# Patient Record
Sex: Female | Born: 2000 | Race: Black or African American | Hispanic: No | Marital: Single | Smoking: Never smoker
Health system: Southern US, Community
[De-identification: ages and names within clinical notes are randomized; demographics above are authoritative.]

---

## 2021-08-11 ENCOUNTER — Other Ambulatory Visit: Payer: Self-pay

## 2021-08-11 ENCOUNTER — Encounter: Payer: Self-pay | Admitting: Nurse Practitioner

## 2021-08-11 ENCOUNTER — Encounter (INDEPENDENT_AMBULATORY_CARE_PROVIDER_SITE_OTHER): Payer: Self-pay

## 2021-08-11 ENCOUNTER — Ambulatory Visit (INDEPENDENT_AMBULATORY_CARE_PROVIDER_SITE_OTHER): Payer: Self-pay | Admitting: Nurse Practitioner

## 2021-08-11 VITALS — BP 110/75 | HR 78 | Temp 99.3°F | Ht 71.0 in | Wt 98.4 lb

## 2021-08-11 DIAGNOSIS — M62838 Other muscle spasm: Secondary | ICD-10-CM

## 2021-08-11 MED ORDER — TIZANIDINE HCL 4 MG PO TABS
4.0000 mg | ORAL_TABLET | Freq: Three times a day (TID) | ORAL | 0 refills | Status: AC | PRN
Start: 2021-08-11 — End: 2021-08-16

## 2021-08-11 NOTE — Patient Instructions (Addendum)
1. Muscle spasms of neck ? ? ?- tiZANidine (ZANAFLEX) 4 MG tablet; Take 1 tablet (4 mg total) by mouth every 8 (eight) hours as needed for up to 5 days for muscle spasms.  Dispense: 15 tablet; Refill: 0 ? ?May alternate heat and ice ? ? ?Follow up: ? ?Follow up for physical in 4-6 weeks with Dr. Andrey Campanile or Amy ? ? ?

## 2021-08-11 NOTE — Progress Notes (Signed)
@  Patient ID: Sheena Anderson, female    DOB: July 30, 2000, 21 y.o.   MRN: [id number] ? ?Chief Complaint  ?Patient presents with  ? New Patient (Initial Visit)  ? ? ?Referring provider: ?No ref. provider found ? ? ?HPI ? ?Patient presents today to establish care.  She is not currently on any medications.  She does not have any significant health history.  She recently moved here from [place].  Interpreter was used for her visit today.  Patient states that she does get muscle spasms to her neck.  She does not have the muscle spasms today currently but she states that this is a pretty frequent problem.  She states that she notices it more in cold weather.  She states that she has muscle tightness on both sides of her neck. Denies f/c/s, n/v/d, hemoptysis, PND, chest pain or edema. ? ? ? ?Not on File ? ? ?There is no immunization history on file for this patient. ? ?History reviewed. No pertinent past medical history. ? ?Tobacco History: ?Social History  ? ?Tobacco Use  ?Smoking Status Never  ?Smokeless Tobacco Never  ? ?Counseling given: Not Answered ? ? ?Outpatient Encounter Medications as of 08/11/2021  ?Medication Sig  ? tiZANidine (ZANAFLEX) 4 MG tablet Take 1 tablet (4 mg total) by mouth every 8 (eight) hours as needed for up to 5 days for muscle spasms.  ? ?No facility-administered encounter medications on file as of 08/11/2021.  ? ? ? ?Review of Systems ? ?Review of Systems  ?Constitutional: Negative.   ?HENT: Negative.    ?Cardiovascular: Negative.   ?Gastrointestinal: Negative.   ?Musculoskeletal:  Positive for myalgias.  ?Allergic/Immunologic: Negative.   ?Neurological: Negative.   ?Psychiatric/Behavioral: Negative.     ? ? ? ?Physical Exam ? ?BP 110/75   Pulse 78   Temp 99.3 ?F (37.4 ?C)   Ht 5\' 11"  (1.803 m)   Wt 98 lb 6.4 oz (44.6 kg)   SpO2 (!) 88%   BMI 13.72 kg/m?  ? ?Wt Readings from Last 5 Encounters:  ?08/11/21 98 lb 6.4 oz (44.6 kg)  ? ? ? ?Physical Exam ?Vitals and nursing note reviewed.  ?Constitutional:    ?   General: She is not in acute distress. ?   Appearance: She is well-developed.  ?Cardiovascular:  ?   Rate and Rhythm: Normal rate and regular rhythm.  ?Pulmonary:  ?   Effort: Pulmonary effort is normal.  ?   Breath sounds: Normal breath sounds.  ?Neurological:  ?   Mental Status: She is alert and oriented to person, place, and time.  ? ? ? ?Lab Results: ? ?CBC ?No results found for: WBC, RBC, HGB, HCT, PLT, MCV, MCH, MCHC, RDW, LYMPHSABS, MONOABS, EOSABS, BASOSABS ? ?BMET ?No results found for: NA, K, CL, CO2, GLUCOSE, BUN, CREATININE, CALCIUM, GFRNONAA, GFRAA ? ?BNP ?No results found for: BNP ? ?ProBNP ?No results found for: PROBNP ? ?Imaging: ?No results found. ? ? ?Assessment & Plan:  ? ?Muscle spasms of neck ?- tiZANidine (ZANAFLEX) 4 MG tablet; Take 1 tablet (4 mg total) by mouth every 8 (eight) hours as needed for up to 5 days for muscle spasms.  Dispense: 15 tablet; Refill: 0 ? ?May alternate heat and ice ? ? ?Follow up: ? ?Follow up for physical in 4-6 weeks with Dr. 08/13/21 or Amy ? ? ? ? ?Andrey Campanile, NP ?08/11/2021 ? ?

## 2021-08-11 NOTE — Assessment & Plan Note (Signed)
-   tiZANidine (ZANAFLEX) 4 MG tablet; Take 1 tablet (4 mg total) by mouth every 8 (eight) hours as needed for up to 5 days for muscle spasms.  Dispense: 15 tablet; Refill: 0 ? ?May alternate heat and ice ? ? ?Follow up: ? ?Follow up for physical in 4-6 weeks with Dr. Andrey Campanile or Amy ?

## 2021-09-19 ENCOUNTER — Encounter: Payer: Self-pay | Admitting: Family Medicine

## 2021-10-03 ENCOUNTER — Other Ambulatory Visit: Payer: Self-pay | Admitting: Obstetrics and Gynecology

## 2021-10-03 ENCOUNTER — Ambulatory Visit
Admission: RE | Admit: 2021-10-03 | Discharge: 2021-10-03 | Disposition: A | Discharge status: Home / Self Care | Payer: No Typology Code available for payment source | Source: Ambulatory Visit | Attending: Obstetrics and Gynecology | Admitting: Obstetrics and Gynecology

## 2021-10-03 DIAGNOSIS — R7612 Nonspecific reaction to cell mediated immunity measurement of gamma interferon antigen response without active tuberculosis: Secondary | ICD-10-CM

## 2023-03-14 ENCOUNTER — Ambulatory Visit (LOCAL_COMMUNITY_HEALTH_CENTER): Payer: Self-pay

## 2023-03-14 DIAGNOSIS — Z23 Encounter for immunization: Secondary | ICD-10-CM

## 2023-03-14 DIAGNOSIS — Z719 Counseling, unspecified: Secondary | ICD-10-CM

## 2023-03-15 NOTE — Progress Notes (Signed)
In nurse clinic today for immunizations. Received covid,flu and polio vaccines today. Tolerated well. VIS given. [facility] updated and copy given. No adverse effects after after receiving covid vaccine. Pt qualified for State supply Covid vaccine.Paid for Flu and Polio vaccine. Pt is uninsured.

## 2023-04-17 IMAGING — CR DG CHEST 1V
1 series · 1 of 1 positions shown · non-contrast
Comparison: None Available.

CLINICAL DATA: Positive TB gold test.

EXAM:
CHEST  1 VIEW

[w chest pa]
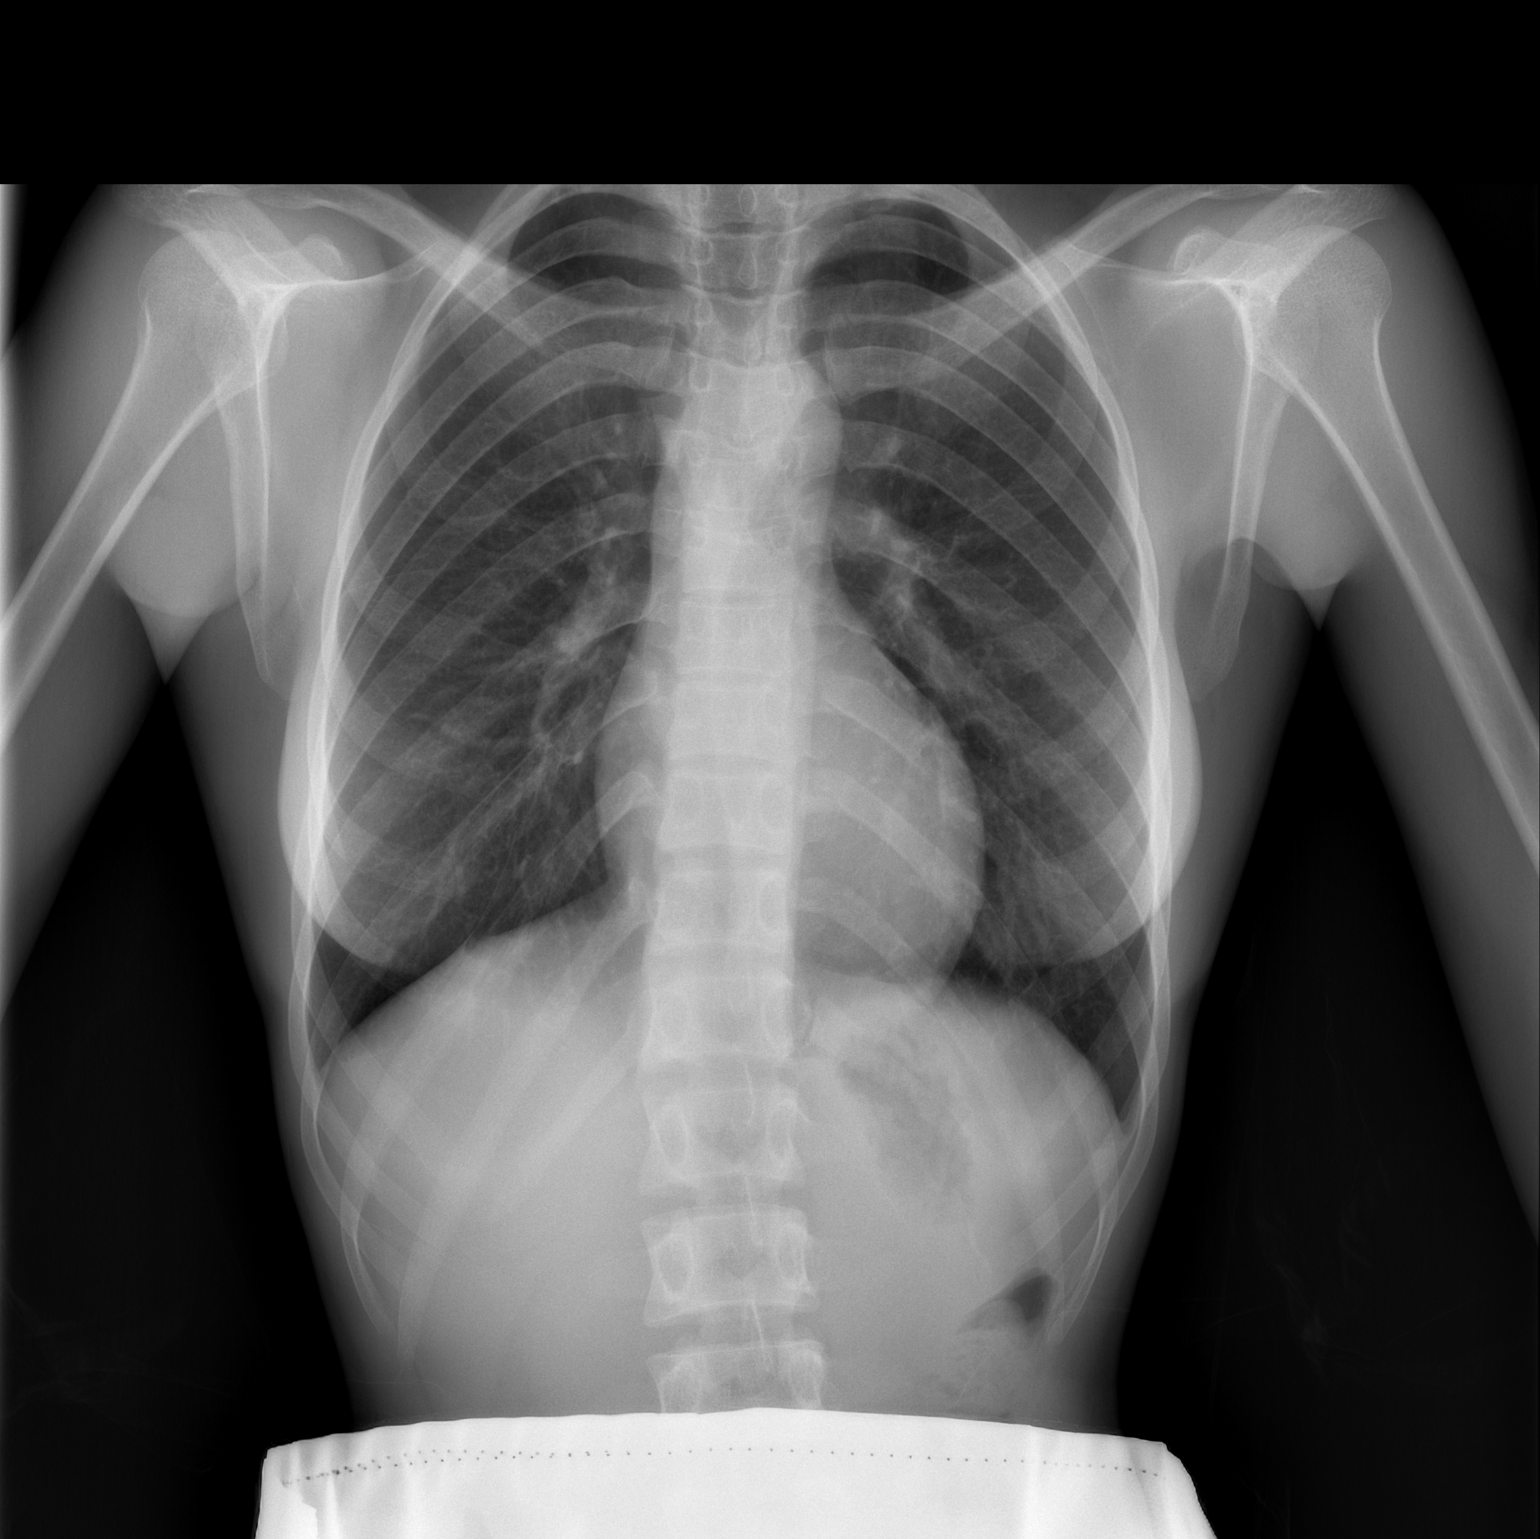

[1 of 1 positions shown; findings below may reference images not displayed]

FINDINGS: The heart size and mediastinal contours are within normal limits.
Both lungs are clear. There is scoliosis of spine.
IMPRESSION: No active disease.

## 2024-05-01 ENCOUNTER — Ambulatory Visit
Admission: RE | Admit: 2024-05-01 | Discharge: 2024-05-01 | Disposition: A | Discharge status: Home / Self Care | Source: Ambulatory Visit | Attending: Family Medicine

## 2024-05-01 VITALS — BP 116/85 | HR 71 | Temp 97.7°F | Resp 16

## 2024-05-01 DIAGNOSIS — E162 Hypoglycemia, unspecified: Secondary | ICD-10-CM | POA: Diagnosis not present

## 2024-05-01 DIAGNOSIS — R42 Dizziness and giddiness: Secondary | ICD-10-CM | POA: Diagnosis not present

## 2024-05-01 DIAGNOSIS — R112 Nausea with vomiting, unspecified: Secondary | ICD-10-CM | POA: Diagnosis not present

## 2024-05-01 LAB — GLUCOSE, POCT (MANUAL RESULT ENTRY): POC Glucose: 54 mg/dL — AB (ref 70–99)

## 2024-05-01 MED ORDER — ONDANSETRON 4 MG PO TBDP: 4.0000 mg | ORAL_TABLET | Freq: Three times a day (TID) | ORAL | 0 refills | Status: AC | PRN

## 2024-05-01 MED ORDER — ONDANSETRON 4 MG PO TBDP
4.0000 mg | ORAL_TABLET | Freq: Once | ORAL | Status: AC
  Administered 2024-05-01: 4 mg via ORAL

## 2024-05-01 NOTE — ED Provider Notes (Signed)
 [facility]  Note:  This document was prepared using Conservation officer, historic buildings and may include unintentional dictation errors.  MRN: [id number] DOB: [birth year]  Subjective:   Sheena Anderson is a 23 y.o. female presenting for 1 day history of intermittent dizziness, nausea. Had an episode of vomiting yesterday after eating dinner ~20:00. Generally eats twice daily. Does not experience decreased appetite. Has tried to drink some fluids today. Has not eaten. Denies fevers, headache, confusion, vision changes, chest pain, shob, hematemesis, bloody stools, diarrhea, belly pain. No chance of pregnancy per patient.   No current facility-administered medications for this encounter. No current outpatient medications on file.   Allergies  Allergen Reactions   Other     Pt reports she is allergic to an antibiotic she had in [place] 2 years ago, but she can not remember the name. Reports her body was swelling.     History reviewed. No pertinent past medical history.   History reviewed. No pertinent surgical history.  History reviewed. No pertinent family history.  Social History   Tobacco Use   Smoking status: Never   Smokeless tobacco: Never  Vaping Use   Vaping status: Never Used  Substance Use Topics   Alcohol use: Not Currently   Drug use: Never    ROS   Objective:   Vitals: BP 116/85 (BP Location: Right Arm)   Pulse 71   Temp 97.7 F (36.5 C) (Oral)   Resp 16   LMP 04/25/2024 (Approximate)   SpO2 99%   Physical Exam Constitutional:      General: She is not in acute distress.    Appearance: Normal appearance. She is well-developed. She is not ill-appearing, toxic-appearing or diaphoretic.  HENT:     Head: Normocephalic and atraumatic.     Nose: Nose normal.     Mouth/Throat:     Mouth: Mucous membranes are moist.     Pharynx: No pharyngeal swelling, oropharyngeal exudate, posterior oropharyngeal erythema or uvula swelling.     Tonsils:  No tonsillar exudate or tonsillar abscesses. 0 on the right. 0 on the left.  Eyes:     General: No scleral icterus.       Right eye: No discharge.        Left eye: No discharge.     Extraocular Movements: Extraocular movements intact.     Conjunctiva/sclera: Conjunctivae normal.  Cardiovascular:     Rate and Rhythm: Normal rate and regular rhythm.     Heart sounds: Normal heart sounds. No murmur heard.    No friction rub. No gallop.  Pulmonary:     Effort: Pulmonary effort is normal. No respiratory distress.     Breath sounds: No stridor. No wheezing, rhonchi or rales.  Chest:     Chest wall: No tenderness.  Abdominal:     General: Bowel sounds are normal. There is no distension.     Palpations: Abdomen is soft. There is no mass.     Tenderness: There is no abdominal tenderness. There is no right CVA tenderness, left CVA tenderness, guarding or rebound.  Skin:    General: Skin is warm and dry.  Neurological:     General: No focal deficit present.     Mental Status: She is alert and oriented to person, place, and time.     Cranial Nerves: No cranial nerve deficit.     Motor: No weakness.     Coordination: Coordination normal.     Gait: Gait normal.  Psychiatric:  Mood and Affect: Mood normal.        Behavior: Behavior normal.        Thought Content: Thought content normal.        Judgment: Judgment normal.     Results for orders placed or performed during the hospital encounter of 05/01/24 (from the past 24 hours)  POCT CBG (manual entry)     Status: Abnormal   Collection Time: 05/01/24  1:37 PM  Result Value Ref Range   POC Glucose 54 (A) 70 - 99 mg/dl   Patient had 4mg  ODT administered. Was given 8 ounces of ginger soda, 1 pack of peanut butter crackers.   Assessment and Plan :   PDMP not reviewed this encounter.  1. Nausea and vomiting, unspecified vomiting type   2. Dizziness   3. Hypoglycemia    Suspect noninfectious gastroenteritis.  Emphasized need to  push fluids, hydrate, use Zofran  for nausea and vomiting.  Establish care with a new PCP for general screening and follow-up.  Vital signs hemodynamically stable and appropriate for discharge.  Counseled patient on potential for adverse effects with medications prescribed/recommended today, ER and return-to-clinic precautions discussed, patient verbalized understanding.    Christopher Savannah, [place] 05/01/24 1348

## 2024-05-01 NOTE — Discharge Instructions (Addendum)
 Make sure you push fluids drinking mostly water but mix it with Gatorade.  Try to eat light meals including soups, broths and soft foods, fruits.  You may use Zofran  for your nausea and vomiting once every 8 hours.  Please return to the clinic if symptoms worsen or you start having severe abdominal pain not helped by taking Tylenol or start having bloody stools or blood in the vomit.  Establish care with a new PCP with [facility] for a recheck and general screening.

## 2024-05-01 NOTE — ED Triage Notes (Signed)
 Pt reports reports she vomited 1 time yesterday and felt lightheaded at work and felt nauseous after that. Pt denies any nausea at this moment. Pt reports headache, haven't take nay med for complaints.   Pt reports she is allergic to an antibiotic she had in [place] 2 years ago, but she can not remember the name. Reports her body was swelling.
